# Patient Record
Sex: Male | Born: 1960 | Race: White | Hispanic: No | Marital: Single | Smoking: Former smoker
Health system: Southern US, Community
[De-identification: ages and names within clinical notes are randomized; demographics above are authoritative.]

## PROBLEM LIST (undated history)

## (undated) DIAGNOSIS — I10 Essential (primary) hypertension: Secondary | ICD-10-CM

## (undated) DIAGNOSIS — F419 Anxiety disorder, unspecified: Secondary | ICD-10-CM

## (undated) DIAGNOSIS — G43909 Migraine, unspecified, not intractable, without status migrainosus: Secondary | ICD-10-CM

## (undated) HISTORY — PX: OTHER SURGICAL HISTORY: SHX169

---

## 2017-09-14 ENCOUNTER — Other Ambulatory Visit: Payer: Self-pay

## 2017-09-14 ENCOUNTER — Emergency Department (HOSPITAL_BASED_OUTPATIENT_CLINIC_OR_DEPARTMENT_OTHER)
Admission: EM | Admit: 2017-09-14 | Discharge: 2017-09-14 | Disposition: A | Discharge status: Home / Self Care | Attending: Emergency Medicine | Admitting: Emergency Medicine

## 2017-09-14 ENCOUNTER — Emergency Department (HOSPITAL_BASED_OUTPATIENT_CLINIC_OR_DEPARTMENT_OTHER)

## 2017-09-14 ENCOUNTER — Encounter (HOSPITAL_BASED_OUTPATIENT_CLINIC_OR_DEPARTMENT_OTHER): Payer: Self-pay | Admitting: Emergency Medicine

## 2017-09-14 DIAGNOSIS — Z87891 Personal history of nicotine dependence: Secondary | ICD-10-CM | POA: Diagnosis not present

## 2017-09-14 DIAGNOSIS — F419 Anxiety disorder, unspecified: Secondary | ICD-10-CM | POA: Insufficient documentation

## 2017-09-14 DIAGNOSIS — I10 Essential (primary) hypertension: Secondary | ICD-10-CM | POA: Insufficient documentation

## 2017-09-14 DIAGNOSIS — R079 Chest pain, unspecified: Secondary | ICD-10-CM | POA: Diagnosis present

## 2017-09-14 HISTORY — DX: Migraine, unspecified, not intractable, without status migrainosus: G43.909

## 2017-09-14 HISTORY — DX: Essential (primary) hypertension: I10

## 2017-09-14 HISTORY — DX: Anxiety disorder, unspecified: F41.9

## 2017-09-14 MED ORDER — HYDROXYZINE HCL 25 MG PO TABS
25.0000 mg | ORAL_TABLET | Freq: Once | ORAL | Status: AC
  Administered 2017-09-14: 25 mg via ORAL
  Filled 2017-09-14: qty 1
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
## 2017-09-14 NOTE — ED Notes (Signed)
ED Provider at bedside. 
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
## 2017-09-14 NOTE — ED Notes (Signed)
Patient transported to X-ray 

## 2019-09-18 IMAGING — CR DG CHEST 2V
2 series · 2 of 2 positions shown · non-contrast
Comparison: None.

CLINICAL DATA: Chest pain

EXAM:
CHEST - 2 VIEW

[w chest pa]
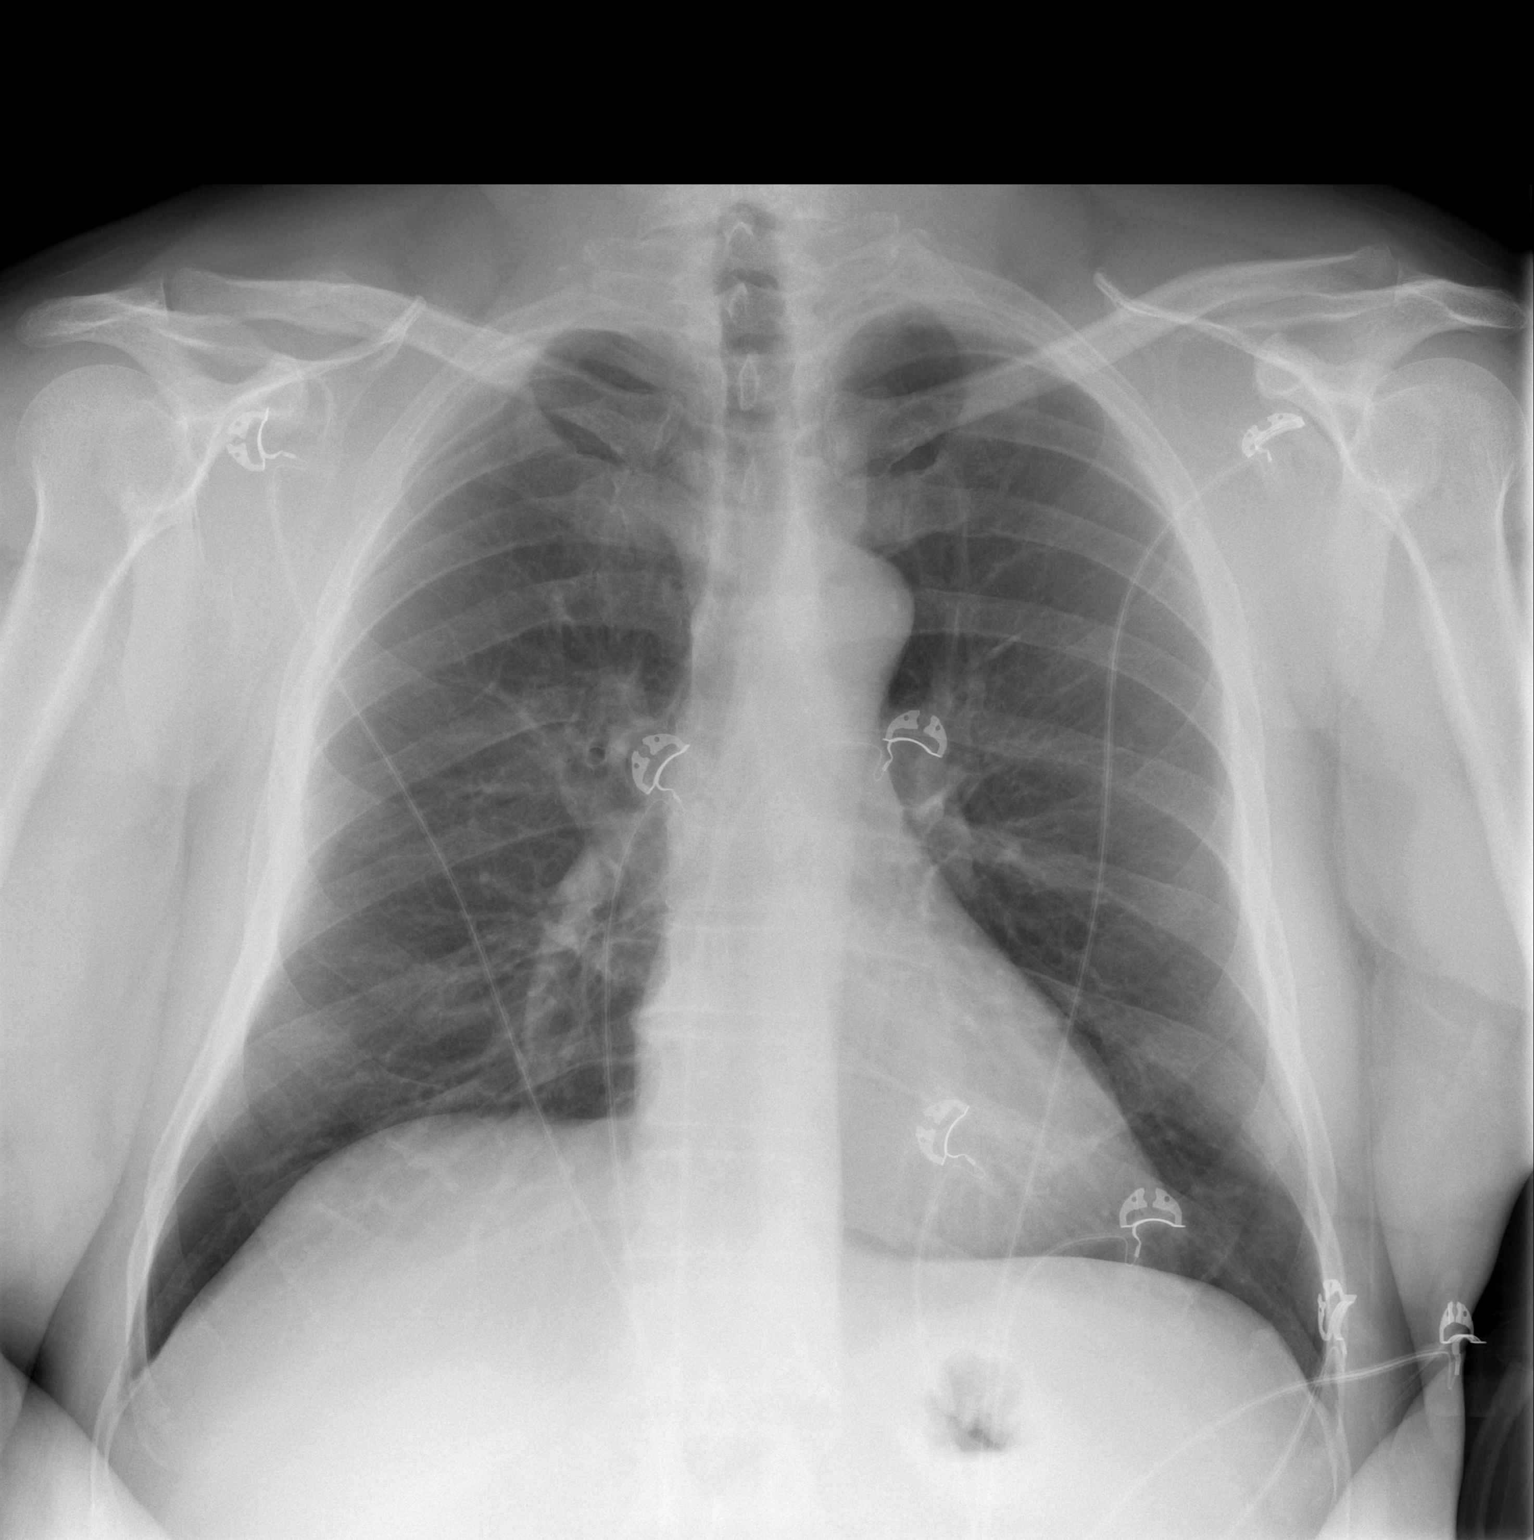

[w chest lat]
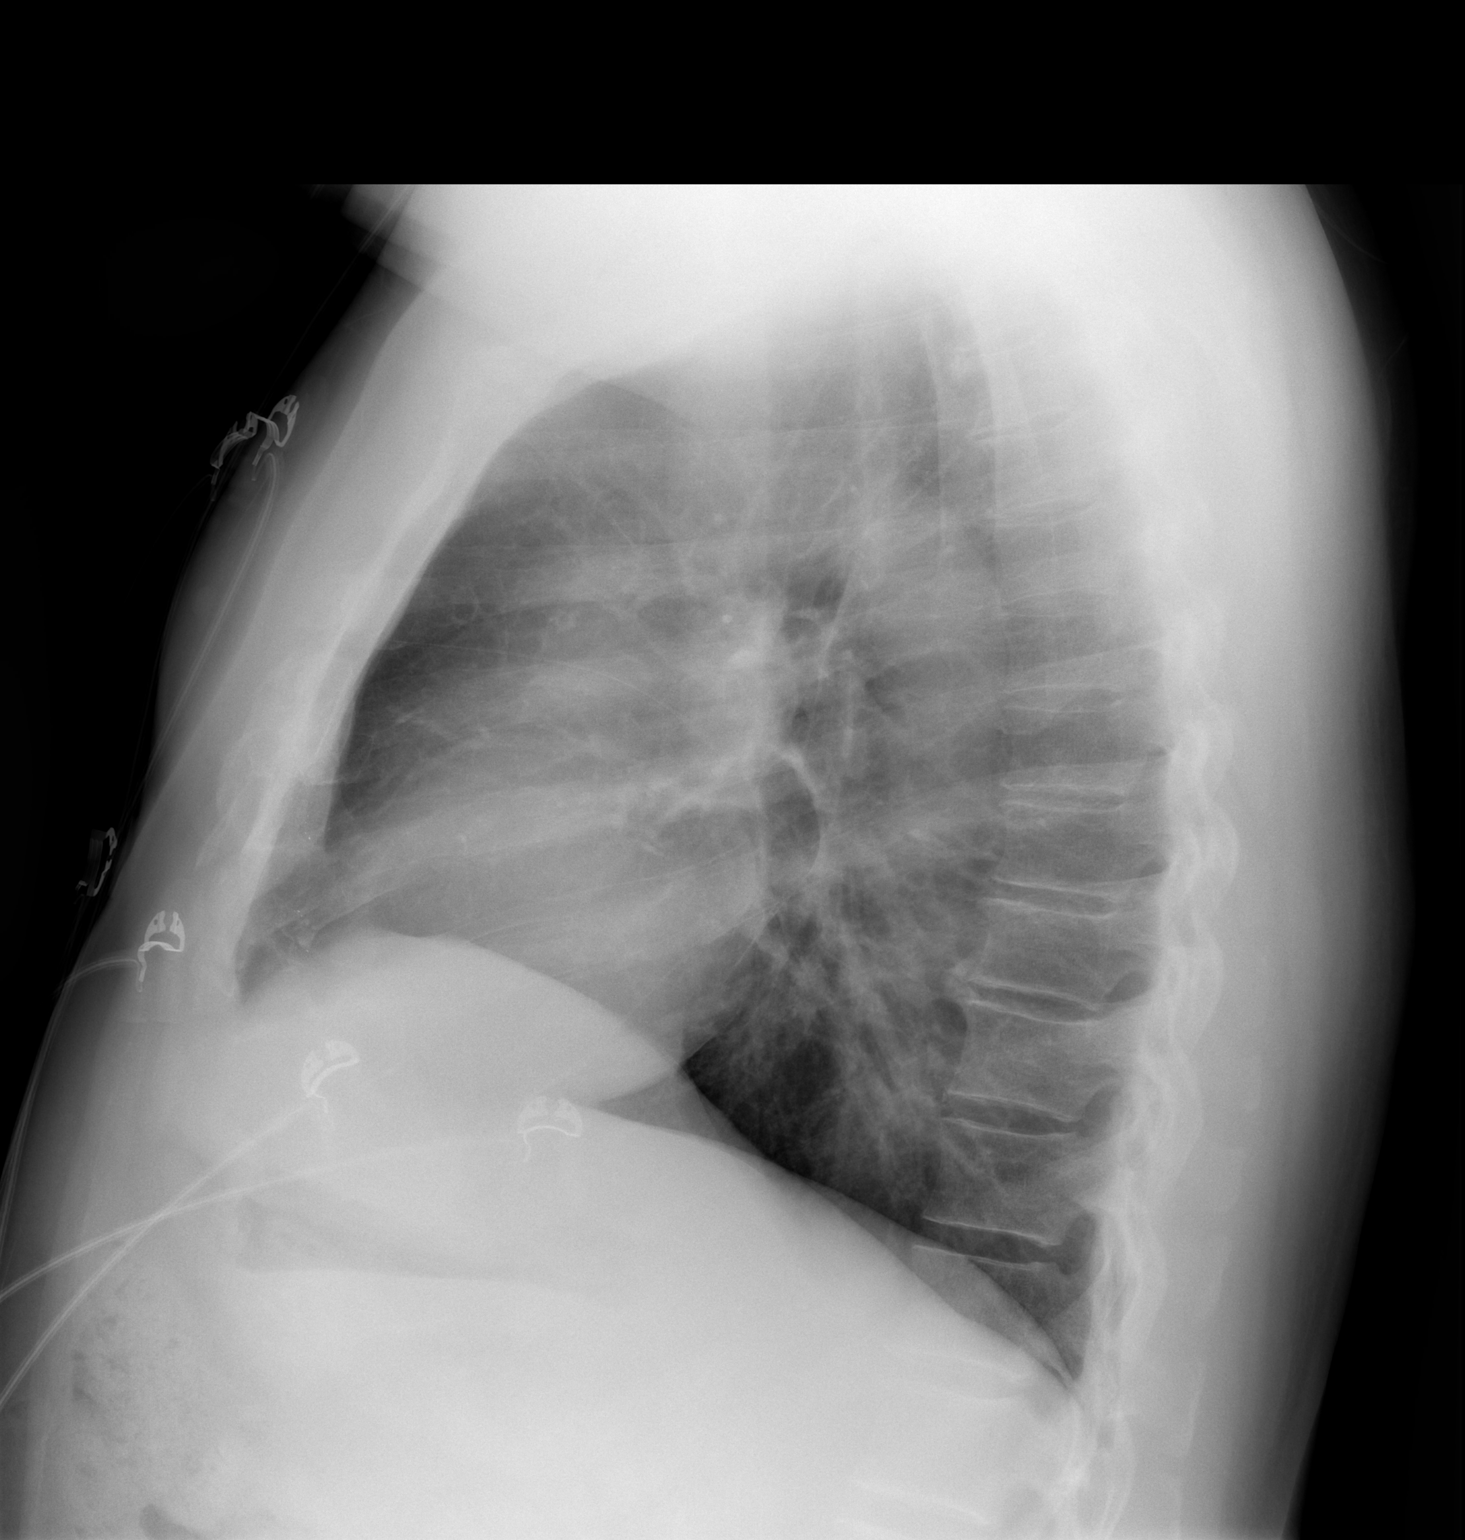

[2 of 2 positions shown; findings below may reference images not displayed]

FINDINGS: Heart and mediastinal contours are within normal limits. No focal
opacities or effusions. No acute bony abnormality.
IMPRESSION: No active cardiopulmonary disease.
